# Patient Record
Sex: Female | Born: 1985 | Race: White | Hispanic: No | Smoking: Never smoker
Health system: Southern US, Community
[De-identification: ages and names within clinical notes are randomized; demographics above are authoritative.]

## PROBLEM LIST (undated history)

## (undated) DIAGNOSIS — G43909 Migraine, unspecified, not intractable, without status migrainosus: Secondary | ICD-10-CM

## (undated) DIAGNOSIS — R569 Unspecified convulsions: Secondary | ICD-10-CM

## (undated) DIAGNOSIS — F419 Anxiety disorder, unspecified: Secondary | ICD-10-CM

## (undated) HISTORY — PX: ABDOMINAL HYSTERECTOMY: SHX81

---

## 2011-01-07 DIAGNOSIS — R569 Unspecified convulsions: Secondary | ICD-10-CM | POA: Insufficient documentation

## 2011-01-07 DIAGNOSIS — O360990 Maternal care for other rhesus isoimmunization, unspecified trimester, not applicable or unspecified: Secondary | ICD-10-CM | POA: Insufficient documentation

## 2013-02-28 DIAGNOSIS — R636 Underweight: Secondary | ICD-10-CM | POA: Insufficient documentation

## 2013-06-13 DIAGNOSIS — N921 Excessive and frequent menstruation with irregular cycle: Secondary | ICD-10-CM | POA: Insufficient documentation

## 2015-01-05 DIAGNOSIS — N946 Dysmenorrhea, unspecified: Secondary | ICD-10-CM | POA: Insufficient documentation

## 2015-01-05 DIAGNOSIS — N926 Irregular menstruation, unspecified: Secondary | ICD-10-CM | POA: Insufficient documentation

## 2019-08-16 DIAGNOSIS — R110 Nausea: Secondary | ICD-10-CM | POA: Insufficient documentation

## 2019-08-16 DIAGNOSIS — Z681 Body mass index (BMI) 19 or less, adult: Secondary | ICD-10-CM | POA: Insufficient documentation

## 2019-08-16 DIAGNOSIS — E639 Nutritional deficiency, unspecified: Secondary | ICD-10-CM | POA: Insufficient documentation

## 2019-08-16 DIAGNOSIS — Z87898 Personal history of other specified conditions: Secondary | ICD-10-CM | POA: Insufficient documentation

## 2019-08-16 DIAGNOSIS — R42 Dizziness and giddiness: Secondary | ICD-10-CM | POA: Insufficient documentation

## 2019-09-02 ENCOUNTER — Encounter (HOSPITAL_BASED_OUTPATIENT_CLINIC_OR_DEPARTMENT_OTHER): Payer: Self-pay | Admitting: Emergency Medicine

## 2019-09-02 ENCOUNTER — Emergency Department (HOSPITAL_BASED_OUTPATIENT_CLINIC_OR_DEPARTMENT_OTHER)
Admission: EM | Admit: 2019-09-02 | Discharge: 2019-09-02 | Disposition: A | Discharge status: Home / Self Care | Payer: 59 | Attending: Emergency Medicine | Admitting: Emergency Medicine

## 2019-09-02 ENCOUNTER — Other Ambulatory Visit: Payer: Self-pay

## 2019-09-02 DIAGNOSIS — R42 Dizziness and giddiness: Secondary | ICD-10-CM | POA: Insufficient documentation

## 2019-09-02 HISTORY — DX: Unspecified convulsions: R56.9

## 2019-09-02 LAB — COMPREHENSIVE METABOLIC PANEL
ALT: 12 U/L (ref 0–44)
AST: 16 U/L (ref 15–41)
Albumin: 3.9 g/dL (ref 3.5–5.0)
Alkaline Phosphatase: 53 U/L (ref 38–126)
Anion gap: 9 (ref 5–15)
BUN: 13 mg/dL (ref 6–20)
CO2: 25 mmol/L (ref 22–32)
Calcium: 9 mg/dL (ref 8.9–10.3)
Chloride: 103 mmol/L (ref 98–111)
Creatinine, Ser: 0.72 mg/dL (ref 0.44–1.00)
GFR calc Af Amer: >60 mL/min (ref >60)
GFR calc non Af Amer: >60 mL/min (ref >60)
Glucose, Bld: 103 mg/dL — ABNORMAL HIGH (ref 70–99)
Potassium: 3.9 mmol/L (ref 3.5–5.1)
Sodium: 137 mmol/L (ref 135–145)
Total Bilirubin: 0.6 mg/dL (ref 0.3–1.2)
Total Protein: 7.4 g/dL (ref 6.5–8.1)

## 2019-09-02 LAB — CBC WITH DIFFERENTIAL/PLATELET
Abs Immature Granulocytes: 0.04 10*3/uL (ref 0.00–0.07)
Basophils Absolute: 0 10*3/uL (ref 0.0–0.1)
Basophils Relative: 1 %
Eosinophils Absolute: 0.1 10*3/uL (ref 0.0–0.5)
Eosinophils Relative: 1 %
HCT: 41.1 % (ref 36.0–46.0)
Hemoglobin: 13.6 g/dL (ref 12.0–15.0)
Immature Granulocytes: 1 %
Lymphocytes Relative: 29 %
Lymphs Abs: 2.5 10*3/uL (ref 0.7–4.0)
MCH: 29.8 pg (ref 26.0–34.0)
MCHC: 33.1 g/dL (ref 30.0–36.0)
MCV: 89.9 fL (ref 80.0–100.0)
Monocytes Absolute: 0.6 10*3/uL (ref 0.1–1.0)
Monocytes Relative: 7 %
Neutro Abs: 5.5 10*3/uL (ref 1.7–7.7)
Neutrophils Relative %: 61 %
Platelets: 191 10*3/uL (ref 150–400)
RBC: 4.57 MIL/uL (ref 3.87–5.11)
RDW: 13.2 % (ref 11.5–15.5)
WBC: 8.6 10*3/uL (ref 4.0–10.5)
nRBC: 0 % (ref 0.0–0.2)

## 2019-09-02 LAB — PREGNANCY, URINE: Preg Test, Ur: NEGATIVE

## 2019-09-02 MED ORDER — SODIUM CHLORIDE 0.9 % IV BOLUS
1000.0000 mL | Freq: Once | INTRAVENOUS | Status: AC
  Administered 2019-09-02: 1000 mL via INTRAVENOUS

## 2019-09-02 NOTE — ED Triage Notes (Signed)
Pt having dizziness for one month. Pt states it is worsening over time.  Pt states she has stumbled and had a few near syncopal episodes.  Pt admits to poor appetite and weight loss since October.  Denies fever.  No N/V/D.

## 2019-09-02 NOTE — Discharge Instructions (Signed)
You have been treated for dizziness.  I recommend that you try to increase calorie intake.  You can have ensure, pedialye, protein shakes, eating foods with high calories and protein.  Also recommend drinking plenty of water throughout the day.  Please follow-up with your primary care doctor within the next week as they can help you with further evaluation and management of loss of appetite and dizziness.  Urgency department if you experience one-sided weakness, slurred speech, visual changes,, shortness of breath, uncontrolled nausea, diarrhea, vomiting, severe abdominal pain as the symptoms require further evaluation.

## 2019-09-02 NOTE — ED Provider Notes (Signed)
[facility] EMERGENCY DEPARTMENT Provider Note   CSN: [id number] Arrival date & time: 09/02/19  1737     History Chief Complaint  Patient presents with  . Dizziness    Bonnie Best is a 34 y.o. female.  HPI   Patient presents to the emergency department with chief complaint of dizziness that has been going on for 1 month.  Patient states when she stands up or becomes really hot she feels dizzy and describes it as the room spinning.  She then has to sit down and it takes about an hour for her to feel better.  She denies any alleviating factors.  Patient denies history of vertigo, anemia, arrhythmias.  Patient had a recent fall 2 days ago after becoming dizzy while finishing up in the bathroom.  She admits to hitting her head but denies visual changes, nausea, vomiting, headache. Patient does admit that her menstrual cycles have been heavier over the last few months saying she will use 3-4 pads in a day.  Last menstrual cycle was May 5 and last about 6 days.  She denies seeing blood in her stool, urine, or recent nosebleeds.  Patient has a significant medical history of epilepsy and states she is not had a seizure in a long time and has not seen a neurologist in a while.  Patient admits to having a decreased appetite, increased fatigue and unexpected weight loss.    Past Medical History:  Diagnosis Date  . Seizures (HCC)     There are no problems to display for this patient.     OB History   No obstetric history on file.     No family history on file.  Social History   Tobacco Use  . Smoking status: Never Smoker  . Smokeless tobacco: Never Used  Substance Use Topics  . Alcohol use: Not on file  . Drug use: Not on file    Home Medications Prior to Admission medications   Not on File    Allergies    Patient has no known allergies.  Review of Systems   Review of Systems  Constitutional: Positive for appetite change, fatigue and unexpected weight change.  Negative for chills and fever.  HENT: Negative for congestion and sore throat.   Eyes: Negative for visual disturbance.  Respiratory: Negative for shortness of breath.   Cardiovascular: Negative for chest pain.  Gastrointestinal: Negative for abdominal pain, blood in stool, diarrhea and vomiting.  Genitourinary: Positive for menstrual problem. Negative for dysuria, enuresis, flank pain and frequency.       Menstrual cycles are heavier  Musculoskeletal: Negative for back pain.       Admits to back pain occurring during her menstrual cycle  Skin: Negative for rash.  Neurological: Positive for dizziness and light-headedness. Negative for numbness and headaches.  Hematological: Does not bruise/bleed easily.    Physical Exam Updated Vital Signs BP 112/84 (BP Location: Right Arm)   Pulse 82   Temp 97.9 F (36.6 C) (Oral)   Resp 16   Ht 5' (1.524 m)   Wt 43.5 kg   LMP 08/14/2019   SpO2 100%   BMI 18.75 kg/m   Physical Exam Vitals and nursing note reviewed.  Constitutional:      General: She is not in acute distress.    Appearance: She is not ill-appearing.  HENT:     Head: Normocephalic and atraumatic.     Nose: No congestion.     Mouth/Throat:     Mouth:  Mucous membranes are moist.     Pharynx: Oropharynx is clear.  Eyes:     General: No scleral icterus. Cardiovascular:     Rate and Rhythm: Normal rate and regular rhythm.     Pulses: Normal pulses.     Heart sounds: No murmur. No friction rub. No gallop.   Pulmonary:     Effort: No respiratory distress.     Breath sounds: No wheezing, rhonchi or rales.  Abdominal:     General: There is no distension.     Tenderness: There is no abdominal tenderness. There is no guarding.  Musculoskeletal:        General: No swelling or tenderness.  Skin:    General: Skin is warm and dry.     Capillary Refill: Capillary refill takes less than 2 seconds.     Findings: No rash.  Neurological:     General: No focal deficit present.      Mental Status: She is alert and oriented to person, place, and time.     Cranial Nerves: No cranial nerve deficit or facial asymmetry.     Sensory: No sensory deficit.     Motor: No weakness or pronator drift.     Coordination: Romberg sign negative. Finger-Nose-Finger Test normal.  Psychiatric:        Mood and Affect: Mood normal.     ED Results / Procedures / Treatments   Labs (all labs ordered are listed, but only abnormal results are displayed) Labs Reviewed  COMPREHENSIVE METABOLIC PANEL - Abnormal; Notable for the following components:      Result Value   Glucose, Bld 103 (*)    All other components within normal limits  PREGNANCY, URINE  CBC WITH DIFFERENTIAL/PLATELET    EKG EKG Interpretation  Date/Time:  Monday Sep 02 2019 19:03:44 EDT Ventricular Rate:  69 PR Interval:    QRS Duration: 91 QT Interval:  387 QTC Calculation: 415 R Axis:   71 Text Interpretation: Sinus rhythm Normal ECG No previous tracing Confirmed by Gwyneth Sprout ([id number]) on 09/02/2019 7:39:45 PM   Radiology No results found.  Procedures Procedures (including critical care time)  Medications Ordered in ED Medications  sodium chloride 0.9 % bolus 1,000 mL (1,000 mLs Intravenous New Bag/Given 09/02/19 1912)    ED Course  I have reviewed the triage vital signs and the nursing notes.  Pertinent labs & imaging results that were available during my care of the patient were reviewed by me and considered in my medical decision making (see chart for details).    MDM Rules/Calculators/A&P                      Orthostatic VS for the past 24 hrs:  BP- Lying Pulse- Lying BP- Sitting Pulse- Sitting BP- Standing at 0 minutes Pulse- Standing at 0 minutes  09/02/19 1922 114/78 73 112/80 79 109/83 84    I personally reviewed patient's labs and imaging and have interpreted myself.  CMP does not show any electrolyte abnormalities, AKI, elevated liver enzymes.  CBC did not show signs of  infection, or anemia.  Urine pregnancy test was less than 5.  EKG was sinus rhythm did not show any ischemia. I have low suspicion that the patient's dizziness is a result of electrolyte abnormalities or cardiac ischemia as her CMP was normal, EKG was sinus rhythm, she is not experiencing  chest pain or shortness of breath.  PE is unlikely as she has low risk factors, is not  short of breath, denies leg pain or swelling and Is not experiencing chest pain. brain mass is unlikely as her neuro exam was normal, she does not have any deficits or complains of a headache.  Patient is not anemic, orthostatic vital signs were negative and she has been normotensive.  Patient does not appear to be in any acute distress, is resting comfortably in bed, is breathing without difficulty.vitals  Signs have remained stable, she has been nontachycardic, normotensive, nontachypneic, satting at 100% room air.  It is likely that the patient's dizziness is a result of weight loss and inadequate rehydration.  Recommend the patient increases her calorie intake and continue to stay hydrated.  I also recommend that she follows up with her primary care doctor.  Patient does not meet admission for emergent admissions to the hospital.  The results and plan above have been explained to the patient patient verbalized that she understood and agrees with above plan.  Final Clinical Impression(s) / ED Diagnoses Final diagnoses:  Dizziness    Rx / DC Orders ED Discharge Orders    None       Aron Baba 09/02/19 2040    Blanchie Dessert, MD 09/03/19 2059

## 2019-09-02 NOTE — ED Notes (Signed)
Ambulated to bathroom with steady gait, pt very thin, states has not been eating well because of stress.

## 2019-09-12 DIAGNOSIS — N920 Excessive and frequent menstruation with regular cycle: Secondary | ICD-10-CM | POA: Insufficient documentation

## 2019-10-18 ENCOUNTER — Other Ambulatory Visit: Payer: Self-pay

## 2019-10-18 ENCOUNTER — Emergency Department (HOSPITAL_BASED_OUTPATIENT_CLINIC_OR_DEPARTMENT_OTHER): Payer: Medicaid Other

## 2019-10-18 ENCOUNTER — Encounter (HOSPITAL_BASED_OUTPATIENT_CLINIC_OR_DEPARTMENT_OTHER): Payer: Self-pay | Admitting: Emergency Medicine

## 2019-10-18 ENCOUNTER — Emergency Department (HOSPITAL_BASED_OUTPATIENT_CLINIC_OR_DEPARTMENT_OTHER)
Admission: EM | Admit: 2019-10-18 | Discharge: 2019-10-19 | Disposition: A | Discharge status: Home / Self Care | Payer: Medicaid Other | Attending: Emergency Medicine | Admitting: Emergency Medicine

## 2019-10-18 DIAGNOSIS — R0789 Other chest pain: Secondary | ICD-10-CM | POA: Insufficient documentation

## 2019-10-18 DIAGNOSIS — R519 Headache, unspecified: Secondary | ICD-10-CM | POA: Insufficient documentation

## 2019-10-18 DIAGNOSIS — R42 Dizziness and giddiness: Secondary | ICD-10-CM | POA: Diagnosis present

## 2019-10-18 DIAGNOSIS — M545 Low back pain: Secondary | ICD-10-CM | POA: Diagnosis not present

## 2019-10-18 DIAGNOSIS — R55 Syncope and collapse: Secondary | ICD-10-CM | POA: Insufficient documentation

## 2019-10-18 DIAGNOSIS — R109 Unspecified abdominal pain: Secondary | ICD-10-CM | POA: Diagnosis not present

## 2019-10-18 HISTORY — DX: Anxiety disorder, unspecified: F41.9

## 2019-10-18 LAB — URINALYSIS, ROUTINE W REFLEX MICROSCOPIC
Bilirubin Urine: NEGATIVE
Glucose, UA: NEGATIVE mg/dL
Hgb urine dipstick: NEGATIVE
Ketones, ur: NEGATIVE mg/dL
Leukocytes,Ua: NEGATIVE
Nitrite: NEGATIVE
Protein, ur: NEGATIVE mg/dL
Specific Gravity, Urine: 1.02 (ref 1.005–1.030)
pH: 7 (ref 5.0–8.0)

## 2019-10-18 LAB — CBC WITH DIFFERENTIAL/PLATELET
Abs Immature Granulocytes: 0.05 10*3/uL (ref 0.00–0.07)
Basophils Absolute: 0 10*3/uL (ref 0.0–0.1)
Basophils Relative: 1 %
Eosinophils Absolute: 0.1 10*3/uL (ref 0.0–0.5)
Eosinophils Relative: 1 %
HCT: 40.7 % (ref 36.0–46.0)
Hemoglobin: 13.5 g/dL (ref 12.0–15.0)
Immature Granulocytes: 1 %
Lymphocytes Relative: 28 %
Lymphs Abs: 2.5 10*3/uL (ref 0.7–4.0)
MCH: 30.3 pg (ref 26.0–34.0)
MCHC: 33.2 g/dL (ref 30.0–36.0)
MCV: 91.5 fL (ref 80.0–100.0)
Monocytes Absolute: 0.5 10*3/uL (ref 0.1–1.0)
Monocytes Relative: 6 %
Neutro Abs: 5.7 10*3/uL (ref 1.7–7.7)
Neutrophils Relative %: 63 %
Platelets: 172 10*3/uL (ref 150–400)
RBC: 4.45 MIL/uL (ref 3.87–5.11)
RDW: 13.4 % (ref 11.5–15.5)
WBC: 8.8 10*3/uL (ref 4.0–10.5)
nRBC: 0 % (ref 0.0–0.2)

## 2019-10-18 LAB — BASIC METABOLIC PANEL
Anion gap: 9 (ref 5–15)
BUN: 12 mg/dL (ref 6–20)
CO2: 24 mmol/L (ref 22–32)
Calcium: 9.2 mg/dL (ref 8.9–10.3)
Chloride: 104 mmol/L (ref 98–111)
Creatinine, Ser: 0.59 mg/dL (ref 0.44–1.00)
GFR calc Af Amer: >60 mL/min (ref >60)
GFR calc non Af Amer: >60 mL/min (ref >60)
Glucose, Bld: 101 mg/dL — ABNORMAL HIGH (ref 70–99)
Potassium: 4.1 mmol/L (ref 3.5–5.1)
Sodium: 137 mmol/L (ref 135–145)

## 2019-10-18 LAB — PREGNANCY, URINE: Preg Test, Ur: NEGATIVE

## 2019-10-18 LAB — D-DIMER, QUANTITATIVE: D-Dimer, Quant: 0.46 ug/mL-FEU (ref 0.00–0.50)

## 2019-10-18 LAB — TROPONIN I (HIGH SENSITIVITY)
Troponin I (High Sensitivity): <2 ng/L (ref <18)
Troponin I (High Sensitivity): <2 ng/L (ref <18)

## 2019-10-18 LAB — TSH: TSH: 1.779 u[IU]/mL (ref 0.350–4.500)

## 2019-10-18 MED ORDER — METHOCARBAMOL 500 MG PO TABS
500.0000 mg | ORAL_TABLET | Freq: Two times a day (BID) | ORAL | 0 refills | Status: DC | PRN
Start: 2019-10-18 — End: 2019-11-13

## 2019-10-18 NOTE — ED Triage Notes (Signed)
Loss of consciousness this afternoon.  Sts she fell onto carpeted surface and has no injury. Has been feeling weak and having dizzy spells for a couple of months.  Has been seen medically and has EEG scheduled this month.  Sts it has been getting worse and came here to see what is causing this.

## 2019-10-18 NOTE — ED Provider Notes (Signed)
[facility] EMERGENCY DEPARTMENT Provider Note   CSN: [id number] Arrival date & time: 10/18/19  1558     History Chief Complaint  Patient presents with  . Loss of Consciousness    Bonnie Best is a 34 y.o. female with history of seizures, anxiety presents for evaluation of persistent and progressively worsening episodes of dizziness.  She reports that she has felt dizzy for several months.  She describes this as more of a lightheadedness as though she may lose consciousness.  She reports that more recently she has been losing consciousness.  Significant other states that she will have episodes daily at this point over the last 2 weeks where while ambulating she will feel lightheaded, short of breath, and lose consciousness for approximately 10 to 20 seconds at a time.  Today she told me that she was ambulating out of the bathroom when she began to feel lightheaded, lost consciousness and fell against the wall.  She states that upon regaining consciousness she felt sharp left-sided chest pains that were constant for 2 to 3 hours before resolving while in the ED.  She noted some associated tingling of the left hand.  Denies nausea, vomiting.  Notes some abdominal cramping and lower back pain which she associates with impending menses.  Was seen and evaluated for similar symptoms at [facility] emergency department and med [facility] on 09/02/2019 with reassuring work-up and recommended outpatient follow-up.  Since then she has seen OB/GYN for heavy menstrual cycles, neurology for history of seizures, and her PCP for refills of hydroxyzine for anxiety which she is new to taking.  Neurology notes that she does have a history of previously abnormal EEG with but with no clinical correlate.  They have arranged for outpatient EEG later this month.  Patient also notes more persistent frontal headaches which she describes as sharp and throbbing with associated intermittent blurred vision for the  last month or so.  She is a non-smoker (quit 13 years ago), denies recreational drug use or alcohol use.  The history is provided by the patient and a significant other.       Past Medical History:  Diagnosis Date  . Anxiety   . Seizures (HCC)     There are no problems to display for this patient.   History reviewed. No pertinent surgical history.   OB History   No obstetric history on file.     No family history on file.  Social History   Tobacco Use  . Smoking status: Never Smoker  . Smokeless tobacco: Never Used  Substance Use Topics  . Alcohol use: Never  . Drug use: Never    Home Medications Prior to Admission medications   Medication Sig Start Date End Date Taking? Authorizing Provider  hydrOXYzine (ATARAX/VISTARIL) 50 MG tablet Take by mouth. 10/01/19  Yes [provider]  methocarbamol (ROBAXIN) 500 MG tablet Take 1 tablet (500 mg total) by mouth 2 (two) times daily as needed for muscle spasms. 10/18/19   Michela Pitcher A, PA-C    Allergies    Patient has no known allergies.  Review of Systems   Review of Systems  Constitutional: Negative for chills and fever.  Eyes: Positive for visual disturbance.  Respiratory: Positive for shortness of breath.   Cardiovascular: Positive for chest pain.  Gastrointestinal: Positive for abdominal pain and nausea. Negative for vomiting.  Musculoskeletal: Positive for back pain.  Neurological: Positive for dizziness, syncope, light-headedness, numbness and headaches.  All other systems reviewed  and are negative.   Physical Exam Updated Vital Signs BP 108/73 (BP Location: Right Arm)   Pulse 71   Temp 97.8 F (36.6 C) (Oral)   Resp 18   Ht 5' (1.524 m)   Wt 45.8 kg   LMP 10/11/2019   SpO2 100%   BMI 19.73 kg/m   Physical Exam Vitals and nursing note reviewed.  Constitutional:      General: She is not in acute distress.    Appearance: She is well-developed.  HENT:     Head: Normocephalic and  atraumatic.     Comments: No Battle's signs, no raccoon's eyes, no rhinorrhea. No hemotympanum. No tenderness to palpation of the face or skull. No deformity, crepitus, or swelling noted.  Eyes:     General:        Right eye: No discharge.        Left eye: No discharge.     Extraocular Movements: Extraocular movements intact.     Conjunctiva/sclera: Conjunctivae normal.     Pupils: Pupils are equal, round, and reactive to light.  Neck:     Vascular: No JVD.     Trachea: No tracheal deviation.  Cardiovascular:     Rate and Rhythm: Normal rate and regular rhythm.  Pulmonary:     Effort: Pulmonary effort is normal.     Breath sounds: Normal breath sounds.  Abdominal:     General: Bowel sounds are normal. There is no distension.     Palpations: Abdomen is soft.     Tenderness: There is no abdominal tenderness. There is no guarding or rebound.  Musculoskeletal:     Cervical back: Neck supple.  Skin:    General: Skin is warm.     Findings: No erythema.  Neurological:     Mental Status: She is alert.     Comments: Mental Status:  Alert, thought content appropriate, able to give a coherent history. Speech fluent without evidence of aphasia. Able to follow 2 step commands without difficulty.  Cranial Nerves:  II:  Peripheral visual fields grossly normal, pupils equal, round, reactive to light III,IV, VI: ptosis not present, extra-ocular motions intact bilaterally  V,VII: smile symmetric, facial light touch sensation equal VIII: hearing grossly normal to voice  X: uvula elevates symmetrically  XI: bilateral shoulder shrug symmetric and strong XII: midline tongue extension without fassiculations Motor:  Normal tone. 5/5 strength of BUE and BLE major muscle groups including strong and equal grip strength and dorsiflexion/plantar flexion, no pronator drift Sensory: light touch normal in all extremities. Cerebellar: normal finger-to-nose with bilateral upper extremities, Romberg sign  absent Gait: normal gait and balance.  Feels lightheaded upon standing  Psychiatric:        Behavior: Behavior normal.     ED Results / Procedures / Treatments   Labs (all labs ordered are listed, but only abnormal results are displayed) Labs Reviewed  BASIC METABOLIC PANEL - Abnormal; Notable for the following components:      Result Value   Glucose, Bld 101 (*)    All other components within normal limits  CBC WITH DIFFERENTIAL/PLATELET  URINALYSIS, ROUTINE W REFLEX MICROSCOPIC  PREGNANCY, URINE  D-DIMER, QUANTITATIVE (NOT AT [facility])  TSH  TROPONIN I (HIGH SENSITIVITY)  TROPONIN I (HIGH SENSITIVITY)    EKG EKG Interpretation  Date/Time:  Friday October 18 2019 16:13:07 EDT Ventricular Rate:  86 PR Interval:  126 QRS Duration: 74 QT Interval:  368 QTC Calculation: 440 R Axis:   85 Text Interpretation: Normal  sinus rhythm Normal ECG No STEMI Confirmed by Alona BeneLong, Joshua [phone]([id number]) on 10/18/2019 9:31:52 PM   Radiology CT Head Wo Contrast  Result Date: 10/18/2019 CLINICAL DATA:  Syncope. EXAM: CT HEAD WITHOUT CONTRAST TECHNIQUE: Contiguous axial images were obtained from the base of the skull through the vertex without intravenous contrast. COMPARISON:  Sep 02, 2019 FINDINGS: Brain: No evidence of acute infarction, hemorrhage, hydrocephalus, extra-axial collection or mass lesion/mass effect. Vascular: No hyperdense vessel or unexpected calcification. Skull: Normal. Negative for fracture or focal lesion. Sinuses/Orbits: No acute finding. Other: None. IMPRESSION: No acute intracranial pathology. Electronically Signed   By: Aram Candelahaddeus  Houston M.D.   On: 10/18/2019 21:10    Procedures Procedures (including critical care time)  Medications Ordered in ED Medications - No data to display  ED Course  I have reviewed the triage vital signs and the nursing notes.  Pertinent labs & imaging results that were available during my care of the patient were reviewed by me and considered in my  medical decision making (see chart for details).    MDM Rules/Calculators/A&P                          Patient presenting for evaluation of numerous complaints that have been progressive over the last few months.  Mostly complaining of intermittent syncopal episodes.  She is afebrile, vital signs are stable in the ED.  She is nontoxic in appearance.  She has a normal neurologic examination with no focal deficits.  She does endorse feeling a little lightheaded with standing.  Her orthostatic vital signs are negative.  Her EKG shows no acute ischemic abnormalities and serial troponins are negative.  Doubt ACS/MI at this time and her chest pain sounds quite atypical in nature.  Remainder of lab work reviewed and interpreted by myself shows no leukocytosis, no anemia, no metabolic derangements, no renal insufficiency.  Her UA does not suggest UTI or nephrolithiasis.  Her D-dimer was also negative and I and reassured that she likely does not have a PE.  We did also obtain TSH though this was not result today but can be followed up on by her PCP or one of her specialist.  Head CT shows no acute intracranial abnormalities, no evidence of mass, hemorrhage, or other concerning abnormality.  She is following up with neurology outpatient and is scheduled for an EEG later this month.  While in the ED she has not had any syncopal episodes.  On reevaluation she is resting comfortably.  No further emergent work-up indicated at this time.  I think she would benefit from outpatient follow-up with cardiology to rule out arrhythmia or other concerning cardiac abnormality as the cause of her symptoms.  I also encouraged her to follow-up with her neurologist and her PCP for reevaluation.  Significant other has noted some muscle spasms in inquired about the possibility of trying a muscle relaxant.  We discussed potential side effects associated with muscle relaxers but she is willing to try a low-dose.  She will try half tablet at  night.  Discussed strict ED return precautions.  Patient and significant other verbalized understanding of and agreement with plan and patient is stable for discharge at this time.  Discussed with Dr. Jacqulyn BathLong who reviewed patient's work-up and imaging results and who agrees with assessment and plan at this time.   Final Clinical Impression(s) / ED Diagnoses Final diagnoses:  Syncope and collapse    Rx / DC Orders ED Discharge Orders  Ordered    Ambulatory referral to Cardiology     Discontinue  Reprint    Comments: Syncope - ambulatory monitoring?   10/18/19 2133    methocarbamol (ROBAXIN) 500 MG tablet  2 times daily PRN     Discontinue  Reprint     10/18/19 2315           Jeanie Sewer, PA-C 10/18/19 2328    Maia Plan, MD 10/26/19 [phone]

## 2019-10-18 NOTE — Discharge Instructions (Signed)
Your work-up today was reassuring.  Please follow-up with your neurologist for reevaluation of your symptoms.  We would also recommend follow-up with cardiology to rule out arrhythmia or other cardiac abnormality causing your loss of consciousness.  You can take methocarbamol as needed for muscle relaxation.  This medication can cause drowsiness so typically recommend taking the medicine only at night to help you get to sleep.  Do not drive, drink alcohol or operate heavy machinery while taking the medication throughout the day.  You can also cut the tablets in half.  Return to the emergency department if any concerning signs or symptoms develop.

## 2019-11-12 NOTE — Progress Notes (Signed)
Cardiology Office Note:    Date:  11/13/2019   ID:  Bonnie Best, DOB Sep 30, 1985, MRN [id number]  PCP:  [facility]  Cardiologist:  Norman Herrlich, MD   Referring MD: Maia Plan, MD  ASSESSMENT:    1. Syncope and collapse   2. Adult BMI <19 kg/sq m   3. Seizure disorder (HCC)    PLAN:    In order of problems listed above:  1. Her episodes are atypical but frequent and I think that with a combination of an echocardiogram to screen for underlying heart disease an event monitor for 2 weeks will capture episodes.  If unrevealing she may benefit from head upright tilt testing but to my knowledge is not being done at [facility].  Be seen back in the office in 1 month.  Next appointment 1 month   Medication Adjustments/Labs and Tests Ordered: Current medicines are reviewed at length with the patient today.  Concerns regarding medicines are outlined above.  No orders of the defined types were placed in this encounter.  No orders of the defined types were placed in this encounter.    Chief Complaint  Patient presents with  . Loss of Consciousness  . Chest Pain    History of Present Illness:    Bonnie Best is a 34 y.o. female who is being seen today for the evaluation of syncope at the request of Long, Arlyss Repress, MD.  She was seen in the emergency room med [facility] 10/18/2019 after an episode of syncope in the bathroom;she is seeing neurology and is scheduled for outpatient EEG.  She has had multiple frequent episodes of dizziness.  Her EKG showed sinus rhythm and was normal and extensive labs including D-dimer TSH pregnancy test CBC 2 troponin high-sensitivity's and CT of the head all normal.  Her blood pressure is relatively low 108/73 and I do not see orthostatic signs documented.  I reviewed a neurology consult from 10/01/2019 [facility] that relates she was having documented hypotension and lightheadedness prior to her emergency  room visit.  She has been seen by nutrition for poor eating habits and a BMI of less than 19.  Her weight has declined from 120 pounds in October 2020 to most recently 100 pounds 09/10/2019.  Seen at [facility] ED 09/02/2019 with complaints of lightheadedness and falls.  Her document of blood pressure is 111/74.  Had a visit med [facility] ED 08/30/2019 with blood pressure 04/24/1976 supine 1 098/83 standing.  02/24/2016 she utilized a 7-day ambulatory event monitor there is no report that I can see in the epic care everywhere  She tells me she has had episodes of syncope for the last year and now occurring several times a week.  She is lightheaded and dizzy when she stands but the episodes are not postural in nature they have been witnessed her significant other says her pulse is slow at times cannot get a pulse and says that she is out for up to a few minutes at a time.  She has not been injured with these.  She has shortness of breath with and without the activities and is not having any chest pain.  No palpitation.  She has no known history of congenital rheumatic heart disease.  Her previous event monitor she told me were done for shortness of breath and were unrevealing.  She has been seen by neurology tells me her EEG was unremarkable and told she does not  have a seizure disorder Past Medical History:  Diagnosis Date  . Anxiety   . Seizures (HCC)     History reviewed. No pertinent surgical history.  Current Medications: Current Meds  Medication Sig  . cetirizine (ZYRTEC) 10 MG tablet Take 10 mg by mouth daily.  . ondansetron (ZOFRAN) 8 MG tablet Take 8 mg by mouth every 8 (eight) hours as needed.  . sertraline (ZOLOFT) 25 MG tablet Take 25 mg by mouth daily.     Allergies:   Patient has no known allergies.   Social History   Socioeconomic History  . Marital status: Legally Separated    Spouse name: Not on file  . Number of children: Not on file  . Years of  education: Not on file  . Highest education level: Not on file  Occupational History  . Not on file  Tobacco Use  . Smoking status: Never Smoker  . Smokeless tobacco: Never Used  Substance and Sexual Activity  . Alcohol use: Never  . Drug use: Never  . Sexual activity: Yes    Birth control/protection: None  Other Topics Concern  . Not on file  Social History Narrative  . Not on file   Social Determinants of Health   Financial Resource Strain:   . Difficulty of Paying Living Expenses:   Food Insecurity:   . Worried About Programme researcher, broadcasting/film/video in the Last Year:   . Barista in the Last Year:   Transportation Needs:   . Freight forwarder (Medical):   Marland Kitchen Lack of Transportation (Non-Medical):   Physical Activity:   . Days of Exercise per Week:   . Minutes of Exercise per Session:   Stress:   . Feeling of Stress :   Social Connections:   . Frequency of Communication with Friends and Family:   . Frequency of Social Gatherings with Friends and Family:   . Attends Religious Services:   . Active Member of Clubs or Organizations:   . Attends Banker Meetings:   Marland Kitchen Marital Status:      Family History: The patient's family history includes Heart disease in her maternal grandmother; Hypertension in her mother.  ROS:   Review of Systems  Constitutional: Positive for malaise/fatigue.  HENT: Negative.   Eyes: Negative.   Cardiovascular: Positive for near-syncope and syncope.  Respiratory: Positive for shortness of breath.   Endocrine: Negative.   Hematologic/Lymphatic: Negative.   Skin: Negative.   Musculoskeletal: Negative.   Gastrointestinal: Positive for anorexia.  Genitourinary: Negative.   Psychiatric/Behavioral: Negative.   Allergic/Immunologic: Negative.    Please see the history of present illness.     All other systems reviewed and are negative.  EKGs/Labs/Other Studies Reviewed:    The following studies were reviewed today:   EKG:  EKG  is  ordered today.  The ekg ordered today is personally reviewed and demonstrates sinus rhythm normal  Recent Labs: 09/02/2019: ALT 12 10/18/2019: BUN 12; Creatinine, Ser 0.59; Hemoglobin 13.5; Platelets 172; Potassium 4.1; Sodium 137; TSH 1.779  Recent Lipid Panel No results found for: CHOL, TRIG, HDL, CHOLHDL, VLDL, LDLCALC, LDLDIRECT  Physical Exam:    VS:  BP 104/70 (BP Location: Left Arm, Patient Position: Sitting, Cuff Size: Small)   Pulse 80   Ht 5' (1.524 m)   Wt 102 lb (46.3 kg)   SpO2 99%   BMI 19.92 kg/m     Wt Readings from Last 3 Encounters:  11/13/19 102 lb (46.3 kg)  10/18/19 101 lb (45.8 kg)  09/02/19 96 lb (43.5 kg)    I did orthostatic blood pressures there were no changes immediate in 1 minute GEN: Very thin well nourished, well developed in no acute distress HEENT: Normal NECK: No JVD; No carotid bruits LYMPHATICS: No lymphadenopathy CARDIAC: RRR, no murmurs, rubs, gallops RESPIRATORY:  Clear to auscultation without rales, wheezing or rhonchi  ABDOMEN: Soft, non-tender, non-distended MUSCULOSKELETAL:  No edema; No deformity  SKIN: Warm and dry NEUROLOGIC:  Alert and oriented x 3 PSYCHIATRIC:  Normal affect     Signed, Norman Herrlich, MD  11/13/2019 4:01 PM    [facility]

## 2019-11-13 ENCOUNTER — Other Ambulatory Visit: Payer: Self-pay

## 2019-11-13 ENCOUNTER — Ambulatory Visit (INDEPENDENT_AMBULATORY_CARE_PROVIDER_SITE_OTHER): Payer: Medicaid Other

## 2019-11-13 ENCOUNTER — Encounter: Payer: Self-pay | Admitting: Cardiology

## 2019-11-13 ENCOUNTER — Ambulatory Visit (INDEPENDENT_AMBULATORY_CARE_PROVIDER_SITE_OTHER): Payer: Medicaid Other | Admitting: Cardiology

## 2019-11-13 VITALS — BP 104/70 | HR 80 | Ht 60.0 in | Wt 102.0 lb

## 2019-11-13 DIAGNOSIS — Z681 Body mass index (BMI) 19 or less, adult: Secondary | ICD-10-CM | POA: Diagnosis not present

## 2019-11-13 DIAGNOSIS — R55 Syncope and collapse: Secondary | ICD-10-CM | POA: Diagnosis not present

## 2019-11-13 DIAGNOSIS — G40909 Epilepsy, unspecified, not intractable, without status epilepticus: Secondary | ICD-10-CM | POA: Diagnosis not present

## 2019-11-13 NOTE — Patient Instructions (Signed)
Medication Instructions:  Your physician recommends that you continue on your current medications as directed. Please refer to the Current Medication list given to you today.  *If you need a refill on your cardiac medications before your next appointment, please call your pharmacy*   Lab Work: None If you have labs (blood work) drawn today and your tests are completely normal, you will receive your results only by: . MyChart Message (if you have MyChart) OR . A paper copy in the mail If you have any lab test that is abnormal or we need to change your treatment, we will call you to review the results.   Testing/Procedures: Your physician has requested that you have an echocardiogram. Echocardiography is a painless test that uses sound waves to create images of your heart. It provides your doctor with information about the size and shape of your heart and how well your heart's chambers and valves are working. This procedure takes approximately one hour. There are no restrictions for this procedure.  A zio monitor was ordered today. It will remain on for 14 days. You will then return monitor and event diary in provided box. It takes 1-2 weeks for report to be downloaded and returned to us. We will call you with the results. If monitor falls off or has orange flashing light, please call Zio for further instructions.      Follow-Up: At [facility], you and your health needs are our priority.  As part of our continuing mission to provide you with exceptional heart care, we have created designated Provider Care Teams.  These Care Teams include your primary Cardiologist (physician) and Advanced Practice Providers (APPs -  Physician Assistants and Nurse Practitioners) who all work together to provide you with the care you need, when you need it.  We recommend signing up for the patient portal called "MyChart".  Sign up information is provided on this After Visit Summary.  MyChart is used to  connect with patients for Virtual Visits (Telemedicine).  Patients are able to view lab/test results, encounter notes, upcoming appointments, etc.  Non-urgent messages can be sent to your provider as well.   To learn more about what you can do with MyChart, go to [email or web address].com.    Your next appointment:   4 week(s)  The format for your next appointment:   In Person  Provider:   Brian Munley, MD   Other Instructions   

## 2019-11-18 ENCOUNTER — Other Ambulatory Visit: Payer: Self-pay

## 2019-11-18 ENCOUNTER — Ambulatory Visit (HOSPITAL_COMMUNITY)
Admission: RE | Admit: 2019-11-18 | Discharge: 2019-11-18 | Disposition: A | Discharge status: Home / Self Care | Payer: Medicaid Other | Source: Ambulatory Visit | Attending: Cardiology | Admitting: Cardiology

## 2019-11-18 DIAGNOSIS — F411 Generalized anxiety disorder: Secondary | ICD-10-CM | POA: Insufficient documentation

## 2019-11-18 DIAGNOSIS — R55 Syncope and collapse: Secondary | ICD-10-CM

## 2019-11-18 DIAGNOSIS — Z681 Body mass index (BMI) 19 or less, adult: Secondary | ICD-10-CM

## 2019-11-18 DIAGNOSIS — G40909 Epilepsy, unspecified, not intractable, without status epilepticus: Secondary | ICD-10-CM

## 2019-11-18 DIAGNOSIS — G4700 Insomnia, unspecified: Secondary | ICD-10-CM | POA: Insufficient documentation

## 2019-11-18 NOTE — Progress Notes (Signed)
  Echocardiogram 2D Echocardiogram has been performed.  Koleen G Frank Novelo 11/18/2019, 9:05 AM

## 2019-11-19 ENCOUNTER — Telehealth: Payer: Self-pay

## 2019-11-19 LAB — ECHOCARDIOGRAM COMPLETE: Area-P 1/2: 4.39 cm2

## 2019-11-19 NOTE — Telephone Encounter (Signed)
Spoke with patient regarding results and recommendation.  Patient verbalizes understanding and is agreeable to plan of care. Advised patient to call back with any issues or concerns.  

## 2019-11-19 NOTE — Telephone Encounter (Signed)
-----   Message from Thomasene Ripple, DO sent at 11/19/2019  8:26 AM EDT ----- Normal echo

## 2019-12-18 ENCOUNTER — Telehealth: Payer: Self-pay | Admitting: Cardiology

## 2019-12-18 NOTE — Telephone Encounter (Signed)
Patient calling for heart monitor results.  

## 2019-12-19 NOTE — Telephone Encounter (Signed)
I was able to review her monitor, overall there is no arrhythmias.  She does have some skipped beats from the top and the bottom of her heart.  Seems that she does feels symptoms sometimes when she gets a skipped beat from the top of the heart.  She can discuss in more detail with Dr. Dulce Sellar when she sees him on the 13th.

## 2019-12-20 NOTE — Telephone Encounter (Signed)
Left message on patients voicemail to please return our call.   

## 2019-12-22 NOTE — Progress Notes (Deleted)
Cardiology Office Note:    Date:  12/22/2019   ID:  Bonnie Best, DOB 1985/10/31, MRN [id number]  PCP:  [facility]  Cardiologist:  Norman Herrlich, MD    Referring MD: [facility]    ASSESSMENT:    No diagnosis found. PLAN:    In order of problems listed above:  1. ***   Next appointment: ***   Medication Adjustments/Labs and Tests Ordered: Current medicines are reviewed at length with the patient today.  Concerns regarding medicines are outlined above.  No orders of the defined types were placed in this encounter.  No orders of the defined types were placed in this encounter.   No chief complaint on file.   History of Present Illness:    Bonnie Best is a 34 y.o. female with a hx of syncope last seen in cardiology consultation 11/13/2019.  The episodes were quite atypical for cardiac etiology. Compliance with diet, lifestyle and medications: ***  Echocardiogram was performed 11/18/2019 by no findings of cardiomyopathy or valvular abnormality.  She used an extended ZIO monitor showing rare ventricular and supraventricular arrhythmia.  There were 6 episodes of syncope all showing sinus rhythm and 2 showing isolated APCs.  She had an EEG performed 11/07/2019 that was reported as normal. Past Medical History:  Diagnosis Date  . Anxiety   . Seizures (HCC)     No past surgical history on file.  Current Medications: No outpatient medications have been marked as taking for the 12/23/19 encounter (Appointment) with Baldo Daub, MD.     Allergies:   Patient has no known allergies.   Social History   Socioeconomic History  . Marital status: Legally Separated    Spouse name: Not on file  . Number of children: Not on file  . Years of education: Not on file  . Highest education level: Not on file  Occupational History  . Not on file  Tobacco Use  . Smoking status: Never Smoker  . Smokeless tobacco: Never Used    Substance and Sexual Activity  . Alcohol use: Never  . Drug use: Never  . Sexual activity: Yes    Birth control/protection: None  Other Topics Concern  . Not on file  Social History Narrative  . Not on file   Social Determinants of Health   Financial Resource Strain:   . Difficulty of Paying Living Expenses: Not on file  Food Insecurity:   . Worried About Programme researcher, broadcasting/film/video in the Last Year: Not on file  . Ran Out of Food in the Last Year: Not on file  Transportation Needs:   . Lack of Transportation (Medical): Not on file  . Lack of Transportation (Non-Medical): Not on file  Physical Activity:   . Days of Exercise per Week: Not on file  . Minutes of Exercise per Session: Not on file  Stress:   . Feeling of Stress : Not on file  Social Connections:   . Frequency of Communication with Friends and Family: Not on file  . Frequency of Social Gatherings with Friends and Family: Not on file  . Attends Religious Services: Not on file  . Active Member of Clubs or Organizations: Not on file  . Attends Banker Meetings: Not on file  . Marital Status: Not on file     Family History: The patient's ***family history includes Heart disease in her maternal grandmother; Hypertension in her mother. ROS:   Please see the history of present  illness.    All other systems reviewed and are negative.  EKGs/Labs/Other Studies Reviewed:    The following studies were reviewed today:  EKG:  EKG ordered today and personally reviewed.  The ekg ordered today demonstrates ***  Recent Labs: 09/02/2019: ALT 12 10/18/2019: BUN 12; Creatinine, Ser 0.59; Hemoglobin 13.5; Platelets 172; Potassium 4.1; Sodium 137; TSH 1.779  Recent Lipid Panel No results found for: CHOL, TRIG, HDL, CHOLHDL, VLDL, LDLCALC, LDLDIRECT  Physical Exam:    VS:  There were no vitals taken for this visit.    Wt Readings from Last 3 Encounters:  11/13/19 102 lb (46.3 kg)  10/18/19 101 lb (45.8 kg)  09/02/19  96 lb (43.5 kg)     GEN: *** Well nourished, well developed in no acute distress HEENT: Normal NECK: No JVD; No carotid bruits LYMPHATICS: No lymphadenopathy CARDIAC: ***RRR, no murmurs, rubs, gallops RESPIRATORY:  Clear to auscultation without rales, wheezing or rhonchi  ABDOMEN: Soft, non-tender, non-distended MUSCULOSKELETAL:  No edema; No deformity  SKIN: Warm and dry NEUROLOGIC:  Alert and oriented x 3 PSYCHIATRIC:  Normal affect    Signed, Norman Herrlich, MD  12/22/2019 5:33 PM     [facility]

## 2019-12-23 ENCOUNTER — Ambulatory Visit: Payer: Medicaid Other | Admitting: Cardiology

## 2019-12-23 NOTE — Telephone Encounter (Signed)
Tried calling patient. No answer and no voicemail set up for me to leave a message.  Patient has an appointment with Dr. Dulce Sellar today and will discuss this with him at this time. I will close this phone note.

## 2019-12-26 ENCOUNTER — Encounter (HOSPITAL_BASED_OUTPATIENT_CLINIC_OR_DEPARTMENT_OTHER): Payer: Self-pay | Admitting: Emergency Medicine

## 2019-12-26 ENCOUNTER — Emergency Department (HOSPITAL_BASED_OUTPATIENT_CLINIC_OR_DEPARTMENT_OTHER): Payer: Medicaid Other

## 2019-12-26 ENCOUNTER — Emergency Department (HOSPITAL_BASED_OUTPATIENT_CLINIC_OR_DEPARTMENT_OTHER)
Admission: EM | Admit: 2019-12-26 | Discharge: 2019-12-26 | Disposition: A | Discharge status: Home / Self Care | Payer: Medicaid Other | Attending: Emergency Medicine | Admitting: Emergency Medicine

## 2019-12-26 ENCOUNTER — Other Ambulatory Visit: Payer: Self-pay

## 2019-12-26 DIAGNOSIS — R1084 Generalized abdominal pain: Secondary | ICD-10-CM | POA: Diagnosis present

## 2019-12-26 DIAGNOSIS — U071 COVID-19: Secondary | ICD-10-CM | POA: Insufficient documentation

## 2019-12-26 LAB — HCG, QUANTITATIVE, PREGNANCY: hCG, Beta Chain, Quant, S: <1 m[IU]/mL (ref <5)

## 2019-12-26 LAB — CBC WITH DIFFERENTIAL/PLATELET
Abs Immature Granulocytes: 0.04 10*3/uL (ref 0.00–0.07)
Basophils Absolute: 0 10*3/uL (ref 0.0–0.1)
Basophils Relative: 0 %
Eosinophils Absolute: 0 10*3/uL (ref 0.0–0.5)
Eosinophils Relative: 0 %
HCT: 39.4 % (ref 36.0–46.0)
Hemoglobin: 13.4 g/dL (ref 12.0–15.0)
Immature Granulocytes: 1 %
Lymphocytes Relative: 24 %
Lymphs Abs: 1.6 10*3/uL (ref 0.7–4.0)
MCH: 29.8 pg (ref 26.0–34.0)
MCHC: 34 g/dL (ref 30.0–36.0)
MCV: 87.6 fL (ref 80.0–100.0)
Monocytes Absolute: 0.3 10*3/uL (ref 0.1–1.0)
Monocytes Relative: 5 %
Neutro Abs: 4.6 10*3/uL (ref 1.7–7.7)
Neutrophils Relative %: 70 %
Platelets: 134 10*3/uL — ABNORMAL LOW (ref 150–400)
RBC: 4.5 MIL/uL (ref 3.87–5.11)
RDW: 12.9 % (ref 11.5–15.5)
Smear Review: NORMAL
WBC: 6.5 10*3/uL (ref 4.0–10.5)
nRBC: 0 % (ref 0.0–0.2)

## 2019-12-26 LAB — COMPREHENSIVE METABOLIC PANEL
ALT: 37 U/L (ref 0–44)
AST: 25 U/L (ref 15–41)
Albumin: 3.8 g/dL (ref 3.5–5.0)
Alkaline Phosphatase: 40 U/L (ref 38–126)
Anion gap: 8 (ref 5–15)
BUN: 10 mg/dL (ref 6–20)
CO2: 22 mmol/L (ref 22–32)
Calcium: 9 mg/dL (ref 8.9–10.3)
Chloride: 105 mmol/L (ref 98–111)
Creatinine, Ser: 0.72 mg/dL (ref 0.44–1.00)
GFR calc Af Amer: >60 mL/min (ref >60)
GFR calc non Af Amer: >60 mL/min (ref >60)
Glucose, Bld: 95 mg/dL (ref 70–99)
Potassium: 3.4 mmol/L — ABNORMAL LOW (ref 3.5–5.1)
Sodium: 135 mmol/L (ref 135–145)
Total Bilirubin: 0.6 mg/dL (ref 0.3–1.2)
Total Protein: 7.4 g/dL (ref 6.5–8.1)

## 2019-12-26 LAB — LIPASE, BLOOD: Lipase: 33 U/L (ref 11–51)

## 2019-12-26 MED ORDER — SODIUM CHLORIDE 0.9 % IV BOLUS
1000.0000 mL | Freq: Once | INTRAVENOUS | Status: AC
  Administered 2019-12-26: 1000 mL via INTRAVENOUS

## 2019-12-26 MED ORDER — METOCLOPRAMIDE HCL 5 MG/ML IJ SOLN
10.0000 mg | Freq: Once | INTRAMUSCULAR | Status: AC
  Administered 2019-12-26: 10 mg via INTRAVENOUS
  Filled 2019-12-26: qty 2

## 2019-12-26 MED ORDER — METOCLOPRAMIDE HCL 10 MG PO TABS: 10.0000 mg | ORAL_TABLET | Freq: Four times a day (QID) | ORAL | 0 refills | Status: AC

## 2019-12-26 MED ORDER — ACETAMINOPHEN 325 MG PO TABS
650.0000 mg | ORAL_TABLET | Freq: Once | ORAL | Status: AC
  Administered 2019-12-26: 650 mg via ORAL
  Filled 2019-12-26: qty 2

## 2019-12-26 NOTE — ED Notes (Addendum)
Attempted to use [employer] more than once with no success will use Pt. [employer]ickers for blood work due to [employer] no working with Pt. Computer in room.  RN is limited to use of time in Covid room with PPE

## 2019-12-26 NOTE — ED Triage Notes (Signed)
Pt. Had covid testing done at [facility] [place] and tested positive on Sept. 11,2021

## 2019-12-26 NOTE — ED Provider Notes (Signed)
[facility] EMERGENCY DEPARTMENT Provider Note   CSN: [id number] Arrival date & time: 12/26/19  1346     History Chief Complaint  Patient presents with   Abdominal Pain    Bonnie Best is a 34 y.o. female with a past medical history of anxiety, Covid infection diagnosed on 12/21/2019 presenting to the ED with a chief complaint of body aches, cough, diarrhea, abdominal pain and vomiting.  Has been having symptoms since her diagnosis but worsened today.  She has tried to take her home Zofran with only minimal improvement in her symptoms.  Feels like she is generally weak and cannot keep any fluids down.  She is also been taking Tylenol.  No chronic lung disease.  She reports generalized abdominal pain that is worse when she has a bowel movement.  Denies any bloody stools.  No chest pain, shortness of breath, hemoptysis.  HPI     Past Medical History:  Diagnosis Date   Anxiety    Seizures 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   OB History   No obstetric history on file.     Family History  Problem Relation Age of Onset   Hypertension Mother    Heart disease Maternal Grandmother     Social History   Tobacco Use   Smoking status: Never Smoker   Smokeless tobacco: Never Used  Substance Use Topics   Alcohol use: Never   Drug use: Never     Home Medications Prior to Admission medications   Medication Sig Start Date End Date Taking? Authorizing Provider  cetirizine (ZYRTEC) 10 MG tablet Take 10 mg by mouth daily.    [provider]  ondansetron (ZOFRAN) 8 MG tablet Take 8 mg by mouth every 8 (eight) hours as needed. 10/25/19   [provider]  sertraline (ZOLOFT) 25 MG tablet Take 25 mg by mouth daily. 11/05/19   [provider]    Allergies    Patient has no known allergies.  Review of Systems   Review of Systems  Constitutional: Positive for chills. Negative for appetite change and fever.  HENT: Negative for ear pain, rhinorrhea, sneezing and sore throat.   Eyes: Negative for photophobia and visual disturbance.  Respiratory: Positive for cough. Negative for chest tightness, shortness of breath and wheezing.   Cardiovascular: Negative for chest pain and palpitations.  Gastrointestinal: Positive for abdominal pain, diarrhea, nausea and vomiting. Negative for blood in stool and constipation.  Genitourinary: Negative for dysuria, hematuria and urgency.  Musculoskeletal: Positive for myalgias.  Skin: Negative for rash.  Neurological: Negative for dizziness, weakness and light-headedness.    Physical Exam Updated Vital Signs BP 102/70    Pulse 73    Temp 97.8 F (36.6 C) (Oral)    Resp 13    Ht 5' (1.524 m)    Wt 46.7 kg    LMP 11/25/2019  SpO2 100%    BMI 20.12 kg/m   Physical Exam Vitals and nursing note reviewed.  Constitutional:      General: She is not in acute distress.    Appearance: She is well-developed.  HENT:     Head: Normocephalic and atraumatic.     Nose: Nose normal.  Eyes:     General: No scleral icterus.       Left eye: No discharge.     Conjunctiva/sclera: Conjunctivae normal.  Cardiovascular:     Rate and Rhythm: Normal rate and regular rhythm.     Heart sounds: Normal heart sounds. No murmur heard.  No friction rub. No gallop.   Pulmonary:     Effort:  Pulmonary effort is normal. No respiratory distress.     Breath sounds: Normal breath sounds.  Abdominal:     General: Bowel sounds are normal. There is no distension.     Palpations: Abdomen is soft.     Tenderness: There is generalized abdominal tenderness. There is no guarding.  Musculoskeletal:        General: Normal range of motion.     Cervical back: Normal range of motion and neck supple.  Skin:    General: Skin is warm and dry.     Findings: No rash.  Neurological:     Mental Status: She is alert.     Motor: No abnormal muscle tone.     Coordination: Coordination normal.     ED Results / Procedures / Treatments   Labs (all labs ordered are listed, but only abnormal results are displayed) Labs Reviewed  COMPREHENSIVE METABOLIC PANEL - Abnormal; Notable for the following components:      Result Value   Potassium 3.4 (*)    All other components within normal limits  CBC WITH DIFFERENTIAL/PLATELET - Abnormal; Notable for the following components:   Platelets 134 (*)    All other components within normal limits  LIPASE, BLOOD  HCG, QUANTITATIVE, PREGNANCY    EKG EKG Interpretation  Date/Time:  Thursday December 26 2019 14:29:47 EDT Ventricular Rate:  74 PR Interval:    QRS Duration: 92 QT Interval:  390 QTC Calculation: 433 R Axis:   70 Text Interpretation: Sinus rhythm Borderline T wave abnormalities Confirmed by Tilden Fossa [phone]) on 12/26/2019 2:36:45 PM   Radiology DG Chest Portable 1 View  Result Date: 12/26/2019 CLINICAL DATA:  Cough, COVID EXAM: PORTABLE CHEST 1 VIEW COMPARISON:  None. FINDINGS: The heart size and mediastinal contours are within normal limits. Both lungs are clear. The visualized skeletal structures are unremarkable. IMPRESSION: No active disease. Electronically Signed   By: Charlett Nose M.D.   On: 12/26/2019 15:06    Procedures Procedures (including critical care time)  Medications Ordered in ED Medications  acetaminophen  (TYLENOL) tablet 650 mg (has no administration in time range)  sodium chloride 0.9 % bolus 1,000 mL (0 mLs Intravenous Stopped 12/26/19 1628)  metoCLOPramide (REGLAN) injection 10 mg (10 mg Intravenous Given 12/26/19 1515)    ED Course  I have reviewed the triage vital signs and the nursing notes.  Pertinent labs & imaging results that were available during my care of the patient were reviewed by me and considered in my medical decision making (see chart for details).    MDM Rules/Calculators/A&P                          Lucindy Jarnagin was evaluated in Emergency Department on 12/26/19 for the  symptoms described in the history of present illness. He/she was evaluated in the context of the global COVID-19 pandemic, which necessitated consideration that the patient might be at risk for infection with the SARS-CoV-2 virus that causes COVID-19. Institutional protocols and algorithms that pertain to the evaluation of patients at risk for COVID-19 are in a state of rapid change based on information released by regulatory bodies including the CDC and federal and state organizations. These policies and algorithms were followed during the patient's care in the ED.  34 year old female with past medical history of anxiety, Covid infection diagnosed on 12/21/2019 after symptoms present 2 days prior to this presenting to the ED with body aches, cough, diarrhea, abdominal pain and vomiting.  Been taking Zofran at home with only minimal improvement in symptoms.  Reports generalized weakness, fatigue and decreased p.o. intake.  Reports general abdominal pain that is worse when she has a bowel movement.  On exam patient lungs are clear to auscultation bilaterally.  Abdomen is generally tender without focal tenderness.  She is not hypoxic.  Chest x-ray shows no acute findings.  EKG shows sinus rhythm, no ischemic changes.  hCG is negative.  CMP, CBC and lipase unremarkable.  Patient was given IV fluids and Reglan here with  improvement in her symptoms. Suspect that her symptoms are due to her Covid infection.  She does not qualify for a Mab infusion but will continue with symptomatic treatment at home.  She does not require admission at this time.  She is agreeable to the plan.  Strict return precautions given.  All imaging, if done today, including plain films, CT scans, and ultrasounds, independently reviewed by me, and interpretations confirmed via formal radiology reads.  Patient is hemodynamically stable, in NAD, and able to ambulate in the ED. Evaluation does not show pathology that would require ongoing emergent intervention or inpatient treatment. I explained the diagnosis to the patient. Pain has been managed and has no complaints prior to discharge. Patient is comfortable with above plan and is stable for discharge at this time. All questions were answered prior to disposition. Strict return precautions for returning to the ED were discussed. Encouraged follow up with PCP.   An After Visit Summary was printed and given to the patient.   Portions of this note were generated with Scientist, clinical (histocompatibility and immunogenetics). Dictation errors may occur despite best attempts at proofreading.  Final Clinical Impression(s) / ED Diagnoses Final diagnoses:  COVID-19 virus infection    Rx / DC Orders ED Discharge Orders    None       Dietrich Pates, PA-C 12/26/19 1712    Tilden Fossa, MD 12/27/19 [phone]

## 2019-12-26 NOTE — ED Triage Notes (Addendum)
Per EMS:  Generalized abdominal pain since this am,  Emesis x 1.  Pt is covid positive since 12/21/19.   Pt having diarrhea x 1 week.  No known fever.  Pt c/o dizziness and EMS performed EKG, NSR

## 2019-12-26 NOTE — ED Notes (Signed)
Pt. Reports she came to ER because she feels she is because she vomited one time today and she feels like she can"t eat anything.  Pt. Reports she feels worse and not better.  Pt. VSS .  Pt. In no resp. distress

## 2019-12-26 NOTE — Discharge Instructions (Addendum)
Return to the ER for continued vomiting, chest pain, shortness of breath or worsening abdominal pain.

## 2019-12-30 ENCOUNTER — Telehealth: Payer: Self-pay | Admitting: Cardiology

## 2019-12-30 NOTE — Telephone Encounter (Signed)
Patient calling for monitor results. 

## 2019-12-30 NOTE — Telephone Encounter (Signed)
Pt called and verbalized understanding and will talk further with her PCP. She asked to cancel her 02/2020 appt with Dr. Dulce Sellar.

## 2019-12-30 NOTE — Telephone Encounter (Signed)
Pt called asking about her 11/2019 [facility] results... will need to forward to Dr. Dulce Sellar for review. She wanted him to know that she had multiple seizures/ passing out spells when she wore it and triggered it as much as she could.

## 2019-12-30 NOTE — Telephone Encounter (Signed)
Please tell her I apologize I think what was happening is I was holding this and she had an appointment on 12/23/2019 in the office.  Good news is the monitor is normal and the episode she is having or not associated with any problem in her heart rhythm and that is what I would have told her the day she was due.  At this time I do not think she needs to follow-up with me.

## 2020-02-19 ENCOUNTER — Ambulatory Visit: Payer: Medicaid Other | Admitting: Cardiology

## 2020-06-15 ENCOUNTER — Emergency Department (HOSPITAL_BASED_OUTPATIENT_CLINIC_OR_DEPARTMENT_OTHER)
Admission: EM | Admit: 2020-06-15 | Discharge: 2020-06-15 | Disposition: A | Discharge status: Home / Self Care | Payer: Medicaid Other | Attending: Emergency Medicine | Admitting: Emergency Medicine

## 2020-06-15 ENCOUNTER — Encounter (HOSPITAL_BASED_OUTPATIENT_CLINIC_OR_DEPARTMENT_OTHER): Payer: Self-pay | Admitting: *Deleted

## 2020-06-15 ENCOUNTER — Other Ambulatory Visit: Payer: Self-pay

## 2020-06-15 DIAGNOSIS — S20212A Contusion of left front wall of thorax, initial encounter: Secondary | ICD-10-CM | POA: Insufficient documentation

## 2020-06-15 DIAGNOSIS — Z5321 Procedure and treatment not carried out due to patient leaving prior to being seen by health care provider: Secondary | ICD-10-CM | POA: Insufficient documentation

## 2020-06-15 DIAGNOSIS — W19XXXA Unspecified fall, initial encounter: Secondary | ICD-10-CM | POA: Diagnosis not present

## 2020-06-15 DIAGNOSIS — I959 Hypotension, unspecified: Secondary | ICD-10-CM | POA: Diagnosis not present

## 2020-06-15 DIAGNOSIS — S299XXA Unspecified injury of thorax, initial encounter: Secondary | ICD-10-CM | POA: Diagnosis present

## 2020-06-15 NOTE — ED Triage Notes (Signed)
Hypotension. States she has a hx of seizures and syncope and her MD wants her to keep up with her BP. States she fell 2 nights ago due to dizziness and bruised her left ribs. She is ambulatory to triage.

## 2020-09-29 IMAGING — CT CT HEAD W/O CM
3 series · 15 of 47 positions shown, 18 images · non-contrast
Comparison: September 02, 2019

CLINICAL DATA: Syncope.

EXAM:
CT HEAD WITHOUT CONTRAST
TECHNIQUE: Contiguous axial images were obtained from the base of the skull
through the vertex without intravenous contrast.

[Series 2: head wo · axial · 0.42mm/px · z∈[-290,-164]mm · 9 of 31 slices shown, 12 images]
[im 3/31  brain]
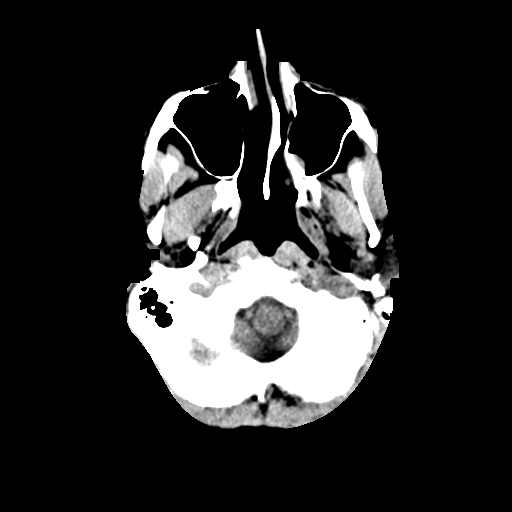
[im 3/31  bone]
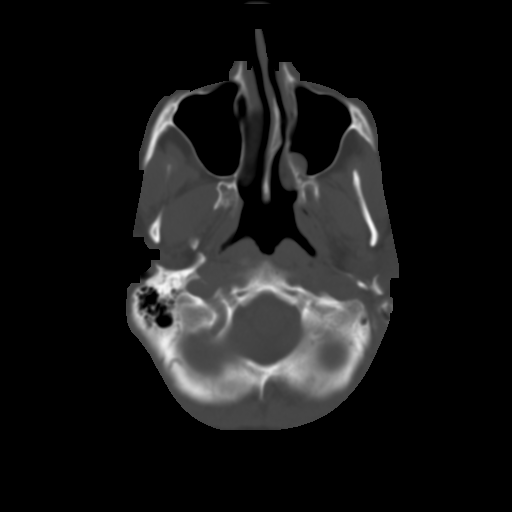
[im 6/31  brain]
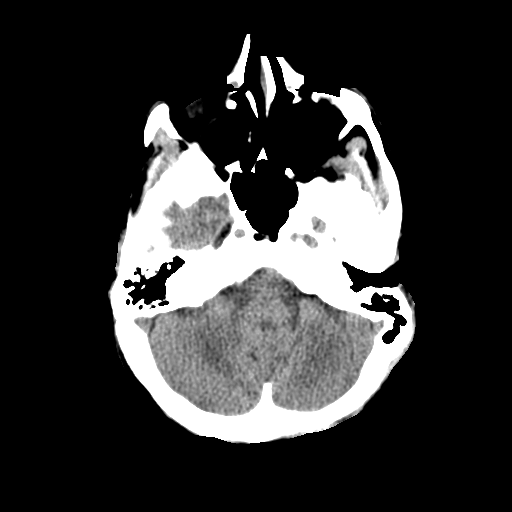
[im 9/31  brain]
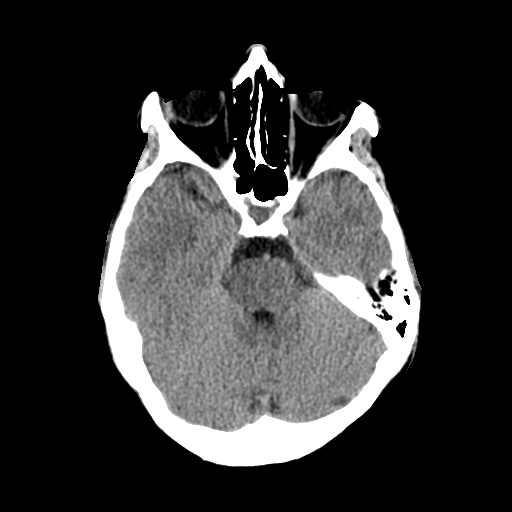
[im 12/31  brain]
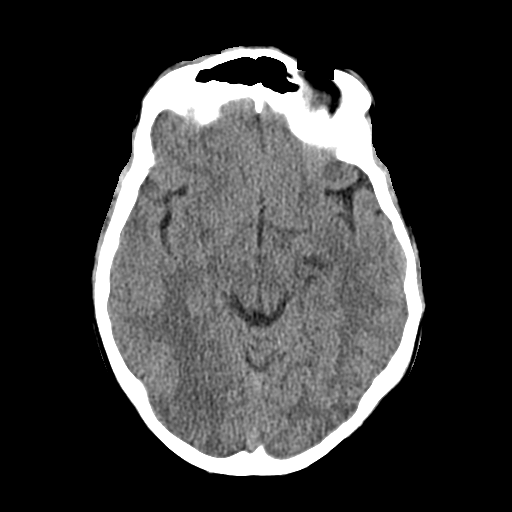
[im 16/31  brain]
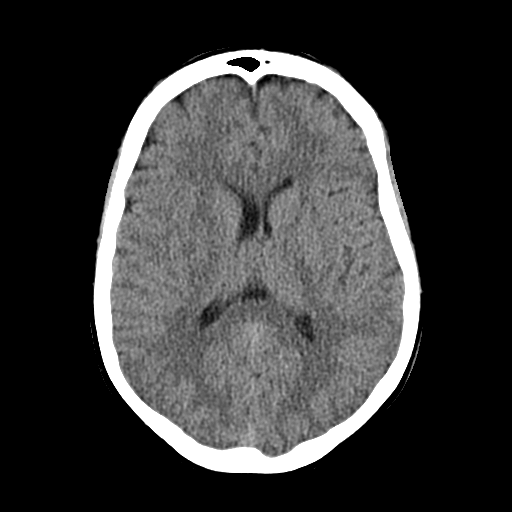
[im 16/31  bone]
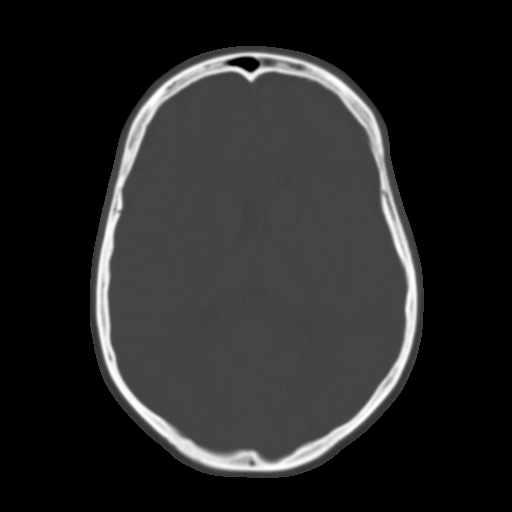
[im 19/31  brain]
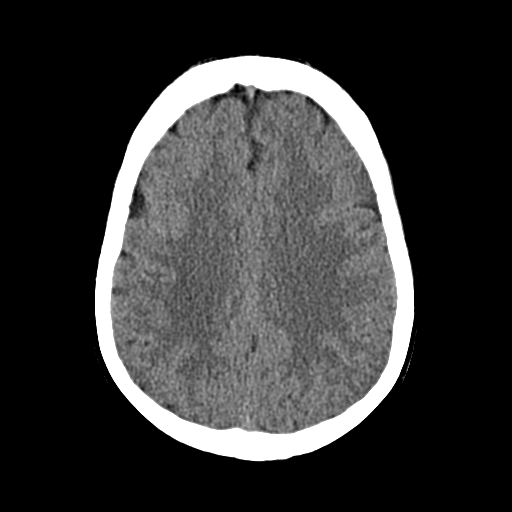
[im 22/31  brain]
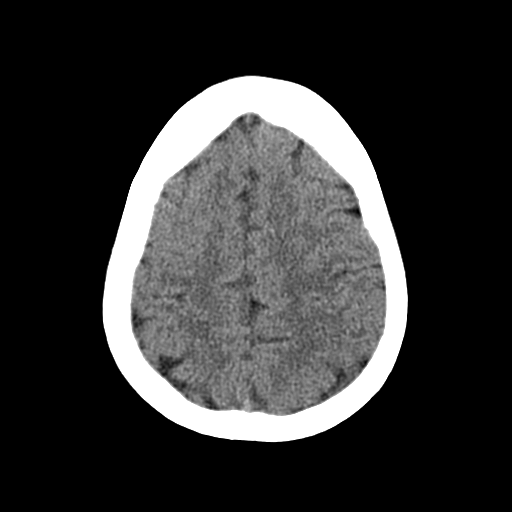
[im 25/31  brain]
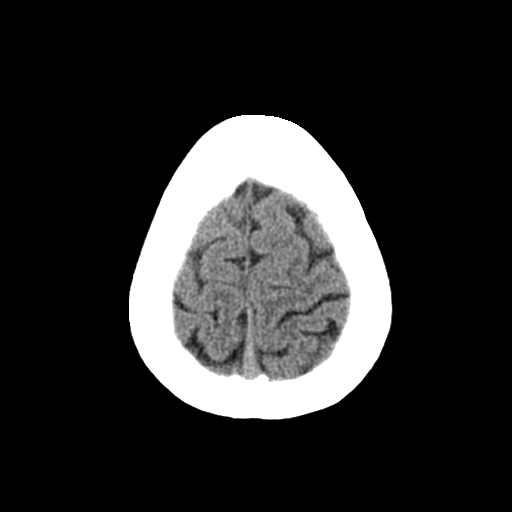
[im 28/31  brain]
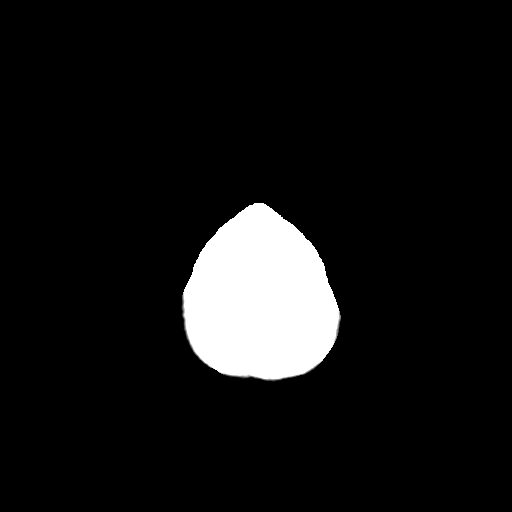
[im 28/31  bone]
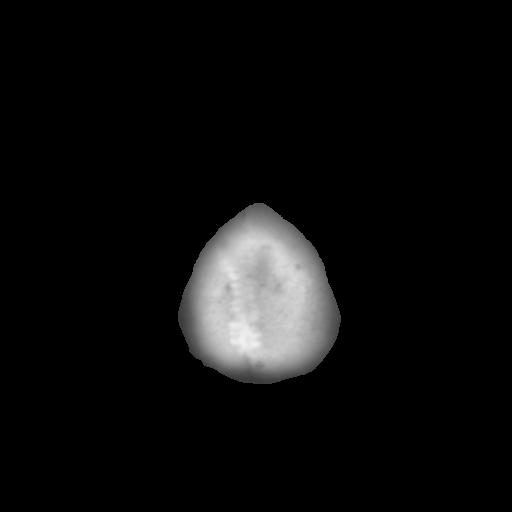

[Series 4: cor soft · coronal · 0.29mm/px · 3 of 68 slices shown]
[im 23/68  brain]
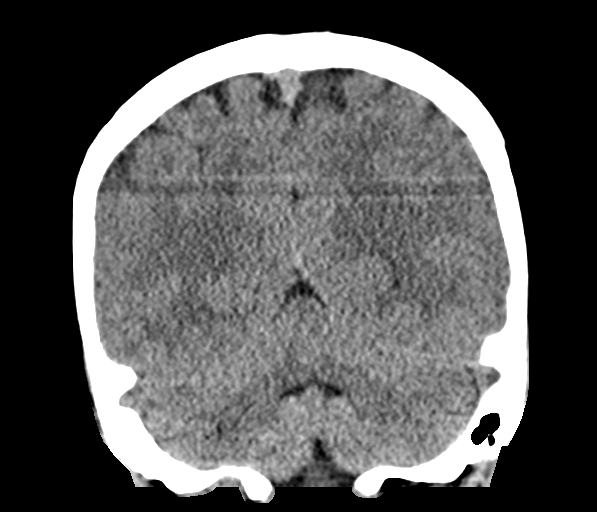
[im 30/68  brain]
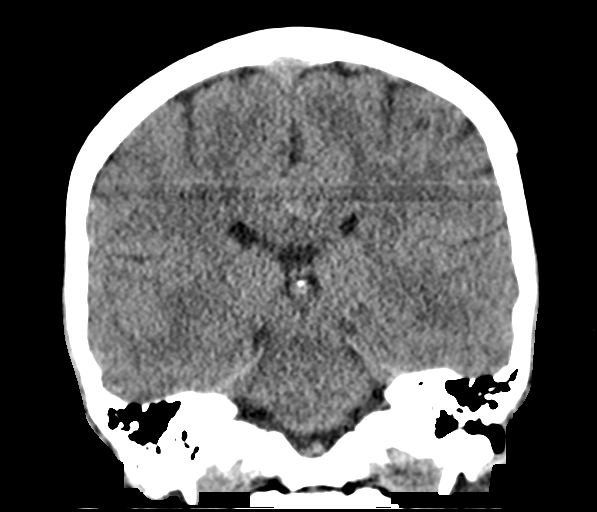
[im 38/68  brain]
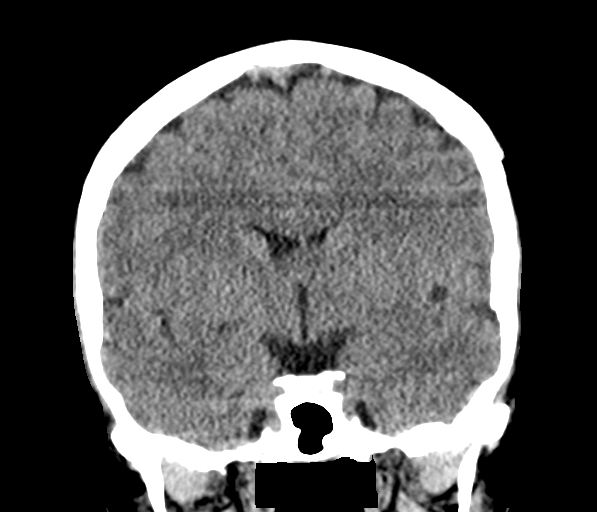

[Series 5: sag soft · sagittal · 0.29mm/px · 3 of 54 slices shown]
[im 18/54  brain]
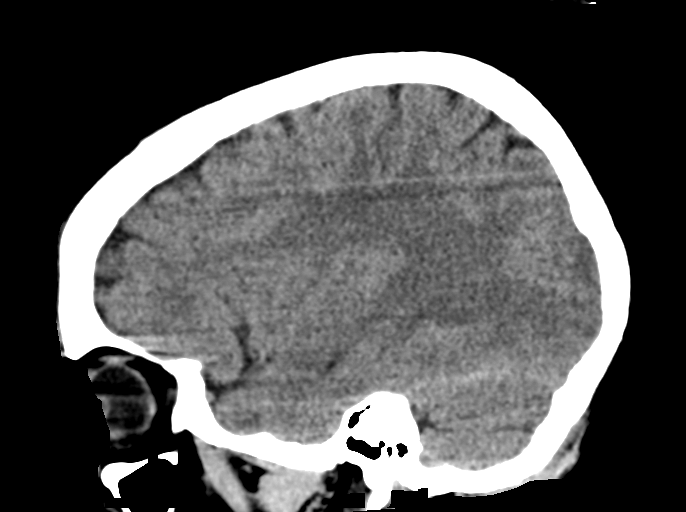
[im 27/54  brain]
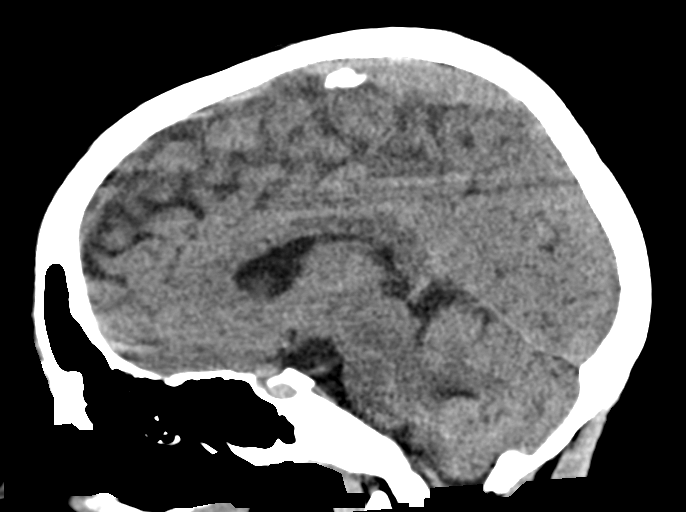
[im 36/54  brain]
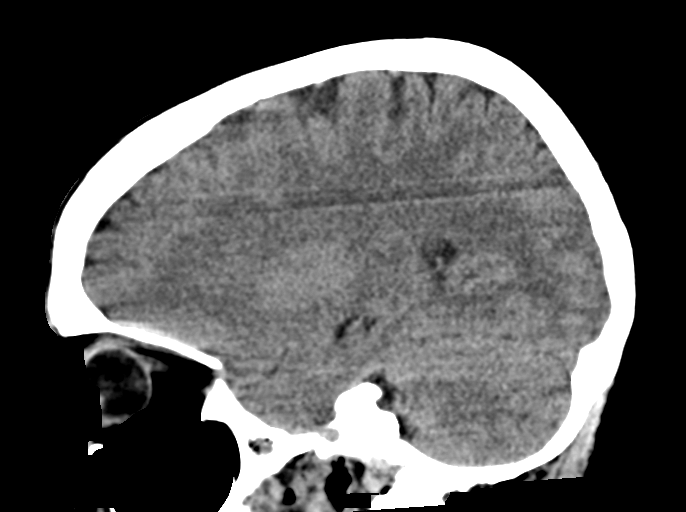

[15 of 47 positions shown; findings below may reference images not displayed]

FINDINGS: Brain: No evidence of acute infarction, hemorrhage, hydrocephalus,
extra-axial collection or mass lesion/mass effect.

Vascular: No hyperdense vessel or unexpected calcification.

Skull: Normal. Negative for fracture or focal lesion.

Sinuses/Orbits: No acute finding.

Other: None.
IMPRESSION: No acute intracranial pathology.

## 2020-12-07 IMAGING — DX DG CHEST 1V PORT
1 series · 1 of 1 positions shown · non-contrast
Comparison: None.

CLINICAL DATA: Cough, COVID

EXAM:
PORTABLE CHEST 1 VIEW

[chest ap]
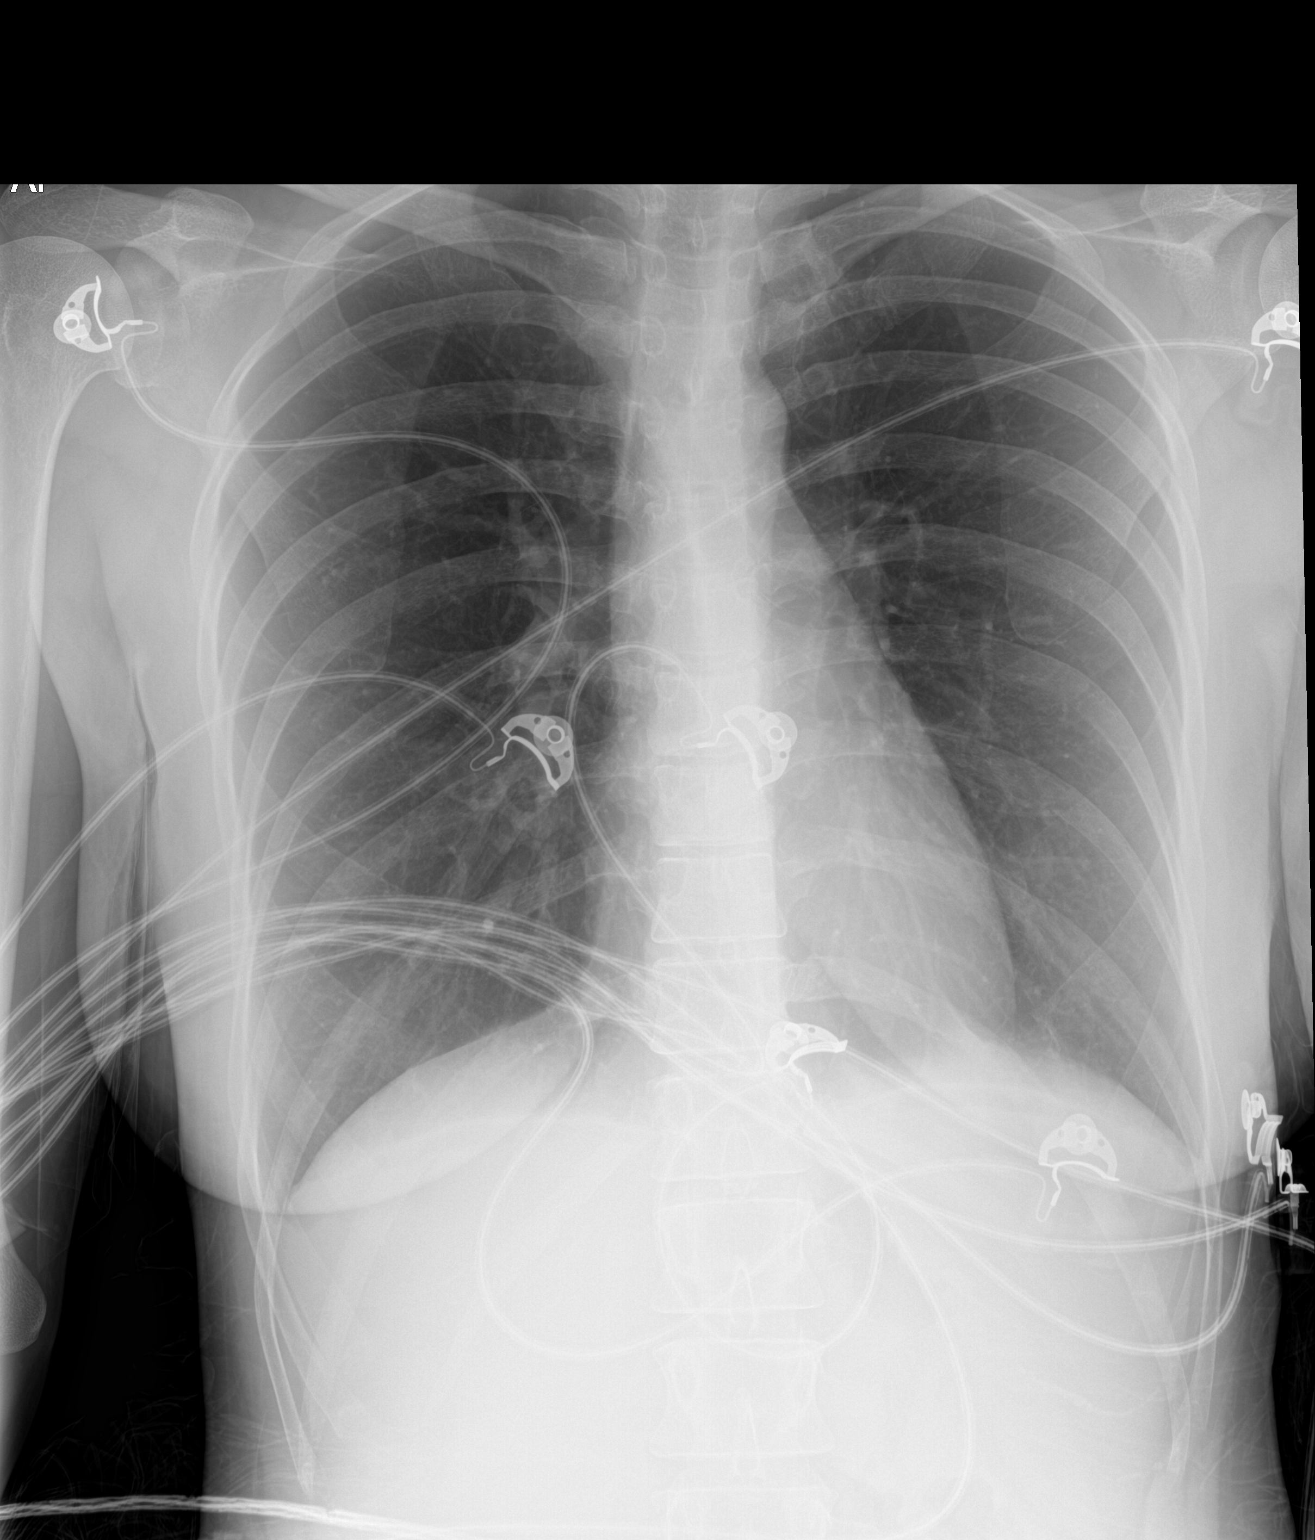

[1 of 1 positions shown; findings below may reference images not displayed]

FINDINGS: The heart size and mediastinal contours are within normal limits.
Both lungs are clear. The visualized skeletal structures are
unremarkable.
IMPRESSION: No active disease.

## 2021-04-12 ENCOUNTER — Other Ambulatory Visit: Payer: Self-pay

## 2021-04-12 ENCOUNTER — Emergency Department (HOSPITAL_BASED_OUTPATIENT_CLINIC_OR_DEPARTMENT_OTHER)
Admission: EM | Admit: 2021-04-12 | Discharge: 2021-04-12 | Disposition: A | Discharge status: Home / Self Care | Payer: Medicaid Other | Attending: Emergency Medicine | Admitting: Emergency Medicine

## 2021-04-12 ENCOUNTER — Encounter (HOSPITAL_BASED_OUTPATIENT_CLINIC_OR_DEPARTMENT_OTHER): Payer: Self-pay

## 2021-04-12 DIAGNOSIS — Z5321 Procedure and treatment not carried out due to patient leaving prior to being seen by health care provider: Secondary | ICD-10-CM | POA: Diagnosis not present

## 2021-04-12 DIAGNOSIS — G43909 Migraine, unspecified, not intractable, without status migrainosus: Secondary | ICD-10-CM | POA: Insufficient documentation

## 2021-04-12 HISTORY — DX: Migraine, unspecified, not intractable, without status migrainosus: G43.909

## 2021-04-12 NOTE — ED Notes (Signed)
Called for room assignment with no response. Consulting civil engineer notified.

## 2021-04-12 NOTE — ED Triage Notes (Signed)
Pt c/o migraine x 2 days-states she does not preventative or onset meds due to insurance issues-NAD-steady gait
